# Patient Record
Sex: Female | Born: 1995 | Race: Black or African American | Hispanic: No | Marital: Single | Smoking: Never smoker
Health system: Southern US, Community
[De-identification: ages and names within clinical notes are randomized; demographics above are authoritative.]

## PROBLEM LIST (undated history)

## (undated) DIAGNOSIS — R011 Cardiac murmur, unspecified: Secondary | ICD-10-CM

## (undated) HISTORY — PX: BREAST EXCISIONAL BIOPSY: SUR124

## (undated) HISTORY — PX: UTERINE FIBROID SURGERY: SHX826

---
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
## 2016-08-07 ENCOUNTER — Encounter (HOSPITAL_COMMUNITY): Payer: Self-pay | Admitting: Emergency Medicine

## 2016-08-07 ENCOUNTER — Emergency Department (HOSPITAL_COMMUNITY)
Admission: EM | Admit: 2016-08-07 | Discharge: 2016-08-07 | Disposition: A | Discharge status: Home / Self Care | Payer: 59 | Attending: Emergency Medicine | Admitting: Emergency Medicine

## 2016-08-07 DIAGNOSIS — R1084 Generalized abdominal pain: Secondary | ICD-10-CM

## 2016-08-07 DIAGNOSIS — Z79899 Other long term (current) drug therapy: Secondary | ICD-10-CM | POA: Diagnosis not present

## 2016-08-07 DIAGNOSIS — D649 Anemia, unspecified: Secondary | ICD-10-CM | POA: Diagnosis not present

## 2016-08-07 DIAGNOSIS — R109 Unspecified abdominal pain: Secondary | ICD-10-CM | POA: Diagnosis present

## 2016-08-07 DIAGNOSIS — N920 Excessive and frequent menstruation with regular cycle: Secondary | ICD-10-CM | POA: Insufficient documentation

## 2016-08-07 LAB — COMPREHENSIVE METABOLIC PANEL
ALT: 18 U/L (ref 14–54)
AST: 38 U/L (ref 15–41)
Albumin: 4.3 g/dL (ref 3.5–5.0)
Alkaline Phosphatase: 54 U/L (ref 38–126)
Anion gap: 10 (ref 5–15)
BUN: 9 mg/dL (ref 6–20)
CO2: 19 mmol/L — ABNORMAL LOW (ref 22–32)
Calcium: 9.2 mg/dL (ref 8.9–10.3)
Chloride: 107 mmol/L (ref 101–111)
Creatinine, Ser: 0.69 mg/dL (ref 0.44–1.00)
GFR calc Af Amer: >60 mL/min (ref >60)
GFR calc non Af Amer: >60 mL/min (ref >60)
Glucose, Bld: 161 mg/dL — ABNORMAL HIGH (ref 65–99)
Potassium: 3.4 mmol/L — ABNORMAL LOW (ref 3.5–5.1)
Sodium: 136 mmol/L (ref 135–145)
Total Bilirubin: 0.6 mg/dL (ref 0.3–1.2)
Total Protein: 8.1 g/dL (ref 6.5–8.1)

## 2016-08-07 LAB — CBC
HCT: 30.4 % — ABNORMAL LOW (ref 36.0–46.0)
Hemoglobin: 8.9 g/dL — ABNORMAL LOW (ref 12.0–15.0)
MCH: 19.5 pg — ABNORMAL LOW (ref 26.0–34.0)
MCHC: 29.3 g/dL — ABNORMAL LOW (ref 30.0–36.0)
MCV: 66.7 fL — ABNORMAL LOW (ref 78.0–100.0)
Platelets: 367 10*3/uL (ref 150–400)
RBC: 4.56 MIL/uL (ref 3.87–5.11)
RDW: 18.6 % — ABNORMAL HIGH (ref 11.5–15.5)
WBC: 8.2 10*3/uL (ref 4.0–10.5)

## 2016-08-07 LAB — URINALYSIS, ROUTINE W REFLEX MICROSCOPIC
Bilirubin Urine: NEGATIVE
Glucose, UA: 50 mg/dL — AB
Hgb urine dipstick: NEGATIVE
Ketones, ur: 5 mg/dL — AB
Leukocytes, UA: NEGATIVE
Nitrite: NEGATIVE
Protein, ur: NEGATIVE mg/dL
Specific Gravity, Urine: 1.027 (ref 1.005–1.030)
pH: 5 (ref 5.0–8.0)

## 2016-08-07 LAB — POC URINE PREG, ED: PREG TEST UR: NEGATIVE

## 2016-08-07 LAB — LIPASE, BLOOD: Lipase: 20 U/L (ref 11–51)

## 2016-08-07 MED ORDER — FERROUS SULFATE 325 (65 FE) MG PO TABS: 325.0000 mg | ORAL_TABLET | Freq: Every day | ORAL | 0 refills | Status: DC

## 2016-08-07 MED ORDER — ONDANSETRON HCL 4 MG/2ML IJ SOLN
4.0000 mg | Freq: Once | INTRAMUSCULAR | Status: AC
  Administered 2016-08-07: 4 mg via INTRAVENOUS
  Filled 2016-08-07: qty 2

## 2016-08-07 MED ORDER — MORPHINE SULFATE (PF) 4 MG/ML IV SOLN
4.0000 mg | Freq: Once | INTRAVENOUS | Status: AC
  Administered 2016-08-07: 4 mg via INTRAVENOUS
  Filled 2016-08-07: qty 1

## 2016-08-07 MED ORDER — ONDANSETRON 4 MG PO TBDP
4.0000 mg | ORAL_TABLET | Freq: Three times a day (TID) | ORAL | 0 refills | Status: DC | PRN
Start: 2016-08-07 — End: 2016-10-09

## 2016-08-07 MED ORDER — IBUPROFEN 600 MG PO TABS: 600.0000 mg | ORAL_TABLET | Freq: Four times a day (QID) | ORAL | 0 refills | Status: DC | PRN

## 2016-08-07 MED ORDER — SODIUM CHLORIDE 0.9 % IV BOLUS (SEPSIS)
1000.0000 mL | Freq: Once | INTRAVENOUS | Status: AC
  Administered 2016-08-07: 1000 mL via INTRAVENOUS

## 2016-08-07 NOTE — ED Notes (Signed)
Pt's temp=98.0

## 2016-08-07 NOTE — ED Triage Notes (Signed)
Pt with vomiting since this morning. Pt c/o abdominal pain.

## 2016-08-07 NOTE — ED Provider Notes (Signed)
[facility]-EMERGENCY DEPT Provider Note   CSN: [id number] Arrival date & time: 08/07/16  1243     History   Chief Complaint Chief Complaint  Patient presents with  . Abdominal Pain  . Emesis    HPI Deborah Hawkins is a 21 y.o. female.  Pt presents to the ED today with abd pain, n/v.  The pt said it started this morning around 0530.  Pt has not had any sick contacts.      History reviewed. No pertinent past medical history.  There are no active problems to display for this patient.   History reviewed. No pertinent surgical history.  OB History    No data available       Home Medications    Prior to Admission medications   Medication Sig Start Date End Date Taking? Authorizing Provider  ferrous sulfate 325 (65 FE) MG tablet Take 1 tablet (325 mg total) by mouth daily. 08/07/16   Jacalyn Lefevre, MD  ibuprofen (ADVIL,MOTRIN) 600 MG tablet Take 1 tablet (600 mg total) by mouth every 6 (six) hours as needed. 08/07/16   Jacalyn Lefevre, MD  ondansetron (ZOFRAN ODT) 4 MG disintegrating tablet Take 1 tablet (4 mg total) by mouth every 8 (eight) hours as needed for nausea or vomiting. 08/07/16   Jacalyn Lefevre, MD    Family History History reviewed. No pertinent family history.  Social History Social History  Substance Use Topics  . Smoking status: Never Smoker  . Smokeless tobacco: Never Used  . Alcohol use Yes     Comment: occasional     Allergies   Patient has no known allergies.   Review of Systems Review of Systems  Gastrointestinal: Positive for abdominal pain, nausea and vomiting.  All other systems reviewed and are negative.    Physical Exam Updated Vital Signs BP (!) 95/49 (BP Location: Right Arm)   Pulse 76   Temp 99.3 F (37.4 C) (Oral)   Resp 12   LMP 07/13/2016 (Approximate)   SpO2 99%   Physical Exam  Constitutional: She is oriented to person, place, and time. She appears well-developed and well-nourished.  HENT:  Head: Normocephalic  and atraumatic.  Right Ear: External ear normal.  Left Ear: External ear normal.  Nose: Nose normal.  Mouth/Throat: Mucous membranes are dry.  Eyes: Conjunctivae and EOM are normal. Pupils are equal, round, and reactive to light.  Neck: Normal range of motion. Neck supple.  Cardiovascular: Normal rate, regular rhythm, normal heart sounds and intact distal pulses.   Pulmonary/Chest: Effort normal and breath sounds normal.  Abdominal: Soft. Bowel sounds are normal. There is generalized tenderness.  Musculoskeletal: Normal range of motion.  Neurological: She is alert and oriented to person, place, and time.  Skin: Skin is warm and dry.  Psychiatric: She has a normal mood and affect. Her behavior is normal. Judgment and thought content normal.  Nursing note and vitals reviewed.    ED Treatments / Results  Labs (all labs ordered are listed, but only abnormal results are displayed) Labs Reviewed  COMPREHENSIVE METABOLIC PANEL - Abnormal; Notable for the following:       Result Value   Potassium 3.4 (*)    CO2 19 (*)    Glucose, Bld 161 (*)    All other components within normal limits  CBC - Abnormal; Notable for the following:    Hemoglobin 8.9 (*)    HCT 30.4 (*)    MCV 66.7 (*)    MCH 19.5 (*)  MCHC 29.3 (*)    RDW 18.6 (*)    All other components within normal limits  URINALYSIS, ROUTINE W REFLEX MICROSCOPIC - Abnormal; Notable for the following:    Glucose, UA 50 (*)    Ketones, ur 5 (*)    All other components within normal limits  LIPASE, BLOOD  POC URINE PREG, ED    EKG  EKG Interpretation None       Radiology No results found.  Procedures Procedures (including critical care time)  Medications Ordered in ED Medications  sodium chloride 0.9 % bolus 1,000 mL (1,000 mLs Intravenous New Bag/Given 08/07/16 1446)  morphine 4 MG/ML injection 4 mg (4 mg Intravenous Given 08/07/16 1442)  ondansetron (ZOFRAN) injection 4 mg (4 mg Intravenous Given 08/07/16 1442)      Initial Impression / Assessment and Plan / ED Course  I have reviewed the triage vital signs and the nursing notes.  Pertinent labs & imaging results that were available during my care of the patient were reviewed by me and considered in my medical decision making (see chart for details).    Pt is feeling much better.  She is able to ambulate.  She is able to eat.  Her hemoglobin is low.  She does admit to heavy periods.  She will be started on iron and instructed to f/u with the women's clinic.  She knows to return if worse.  Final Clinical Impressions(s) / ED Diagnoses   Final diagnoses:  Generalized abdominal pain  Anemia, unspecified type  Menorrhagia with regular cycle    New Prescriptions New Prescriptions   FERROUS SULFATE 325 (65 FE) MG TABLET    Take 1 tablet (325 mg total) by mouth daily.   IBUPROFEN (ADVIL,MOTRIN) 600 MG TABLET    Take 1 tablet (600 mg total) by mouth every 6 (six) hours as needed.   ONDANSETRON (ZOFRAN ODT) 4 MG DISINTEGRATING TABLET    Take 1 tablet (4 mg total) by mouth every 8 (eight) hours as needed for nausea or vomiting.     Jacalyn Lefevre, MD 08/07/16 [phone]

## 2016-08-07 NOTE — ED Notes (Signed)
I stuck patint x1, was only about to get 1/2 cc of blood. Patient has been vomiting since 530 am.

## 2016-08-07 NOTE — ED Notes (Signed)
ED Provider at bedside. 

## 2016-08-07 NOTE — ED Notes (Signed)
Pt was asked if could obtain a urine sample, denied at this time.

## 2016-08-21 ENCOUNTER — Ambulatory Visit (INDEPENDENT_AMBULATORY_CARE_PROVIDER_SITE_OTHER): Payer: 59 | Admitting: Obstetrics & Gynecology

## 2016-08-21 ENCOUNTER — Encounter: Payer: Self-pay | Admitting: Obstetrics & Gynecology

## 2016-08-21 VITALS — BP 121/71 | HR 102 | Ht 65.0 in | Wt 138.4 lb

## 2016-08-21 DIAGNOSIS — L739 Follicular disorder, unspecified: Secondary | ICD-10-CM

## 2016-08-21 DIAGNOSIS — Z113 Encounter for screening for infections with a predominantly sexual mode of transmission: Secondary | ICD-10-CM | POA: Diagnosis not present

## 2016-08-21 DIAGNOSIS — N898 Other specified noninflammatory disorders of vagina: Secondary | ICD-10-CM

## 2016-08-21 DIAGNOSIS — D500 Iron deficiency anemia secondary to blood loss (chronic): Secondary | ICD-10-CM

## 2016-08-21 MED ORDER — NORETHIN ACE-ETH ESTRAD-FE 1-20 MG-MCG(24) PO TABS: 1.0000 | ORAL_TABLET | Freq: Every day | ORAL | 4 refills | Status: DC

## 2016-08-21 MED ORDER — FERROUS SULFATE 325 (65 FE) MG PO TABS: 325.0000 mg | ORAL_TABLET | Freq: Two times a day (BID) | ORAL | 1 refills | Status: DC

## 2016-08-21 MED ORDER — SULFAMETHOXAZOLE-TRIMETHOPRIM 800-160 MG PO TABS: 1.0000 | ORAL_TABLET | Freq: Two times a day (BID) | ORAL | 1 refills | Status: DC

## 2016-08-21 NOTE — Progress Notes (Signed)
History:  21 y.o. No obstetric history on file.G0 here today for viral infection and viral gastroenteritis.  So all of those sx are resolved. But, pt was noted to have anemia. Pt reports monthly cycles. 28-29 days. They last 3-4 day but, are heavy with clots.  The clots can be larger than a sliver dollar. This occurs with each cycle. Pt had a PAP in Dec 2017 at [facility].  Depo Provera last 2 years prev.   Pt prev sexually active. With males last 4 weeks.   Pt c/o white discharge that she has had for 6 months.      The following portions of the patient's history were reviewed and updated as appropriate: allergies, current medications, past family history, past medical history, past social history, past surgical history and problem list.  Review of Systems:  Pertinent items are noted in HPI.   Objective:  Physical Exam Blood pressure 121/71, pulse (!) 102, height  (1.651 m), weight 138 lb 6.4 oz (62.8 kg), last menstrual period 08/12/2016. BP 121/71   Pulse (!) 102   Ht  (1.651 m)   Wt 138 lb 6.4 oz (62.8 kg)   LMP 08/12/2016 (Exact Date)   BMI 23.03 kg/m  CONSTITUTIONAL: Well-developed, well-nourished female in no acute distress.  HENT:  Normocephalic, atraumatic EYES: Conjunctivae and EOM are normal. No scleral icterus.  NECK: Normal range of motion SKIN: Skin is warm and dry. No rash noted. Not diaphoretic.No pallor. NEUROLGIC: Alert and oriented to person, place, and time. Normal coordination.  Abd: Soft, nontender and nondistended Pelvic: pt has evidence of follicles of various ages around perineum and vulva. In the lower abd these are some that are new and pustular in nature.  No warmth or erythema/ Normal appearing vaginal mucosa and cervix.  Normal discharge.    Labs and Imaging CBC    Component Value Date/Time   WBC 8.2 08/07/2016 1435   RBC 4.56 08/07/2016 1435   HGB 8.9 (L) 08/07/2016 1435   HCT 30.4 (L) 08/07/2016 1435   PLT 367 08/07/2016 1435    MCV 66.7 (L) 08/07/2016 1435   MCH 19.5 (L) 08/07/2016 1435   MCHC 29.3 (L) 08/07/2016 1435   RDW 18.6 (H) 08/07/2016 1435      Assessment & Plan:  Vulvar folliculitis Iron def anemia thought due to Menorrhegia  Vaginal discharge- suspect physiologic  Bactrim DS 1 po bid x 7 days Begin OCPs LoEtrin continuous  f/u in 3 months or sooner prn GC/chlmydia screen Wet mount and KOH Keep Ferrous sulfate daily on an empty stomach  Total face-to-face time with patient was 20 min.  Greater than 50% was spent in counseling and coordination of care with the patient.    [name] L. Harraway-Smith, M.D., Evern Core

## 2016-08-22 ENCOUNTER — Other Ambulatory Visit: Payer: Self-pay

## 2016-08-23 LAB — CERVICOVAGINAL ANCILLARY ONLY
BACTERIAL VAGINITIS: POSITIVE — AB
CANDIDA VAGINITIS: POSITIVE — AB
CHLAMYDIA, DNA PROBE: NEGATIVE
Neisseria Gonorrhea: NEGATIVE
Trichomonas: NEGATIVE

## 2016-08-27 ENCOUNTER — Other Ambulatory Visit: Payer: Self-pay | Admitting: Obstetrics & Gynecology

## 2016-08-27 DIAGNOSIS — N760 Acute vaginitis: Principal | ICD-10-CM

## 2016-08-27 DIAGNOSIS — B9689 Other specified bacterial agents as the cause of diseases classified elsewhere: Secondary | ICD-10-CM

## 2016-08-27 DIAGNOSIS — B3731 Acute candidiasis of vulva and vagina: Secondary | ICD-10-CM

## 2016-08-27 DIAGNOSIS — B373 Candidiasis of vulva and vagina: Secondary | ICD-10-CM

## 2016-08-27 MED ORDER — METRONIDAZOLE 500 MG PO TABS: 500.0000 mg | ORAL_TABLET | Freq: Two times a day (BID) | ORAL | 0 refills | Status: DC

## 2016-08-27 MED ORDER — FLUCONAZOLE 150 MG PO TABS: 150.0000 mg | ORAL_TABLET | Freq: Once | ORAL | 3 refills | Status: AC

## 2016-08-30 ENCOUNTER — Emergency Department (HOSPITAL_COMMUNITY)
Admission: EM | Admit: 2016-08-30 | Discharge: 2016-08-31 | Disposition: A | Discharge status: Home / Self Care | Payer: 59 | Attending: Emergency Medicine | Admitting: Emergency Medicine

## 2016-08-30 ENCOUNTER — Encounter (HOSPITAL_COMMUNITY): Payer: Self-pay | Admitting: Emergency Medicine

## 2016-08-30 ENCOUNTER — Emergency Department (HOSPITAL_COMMUNITY): Payer: 59

## 2016-08-30 DIAGNOSIS — Z79899 Other long term (current) drug therapy: Secondary | ICD-10-CM | POA: Insufficient documentation

## 2016-08-30 DIAGNOSIS — S199XXA Unspecified injury of neck, initial encounter: Secondary | ICD-10-CM | POA: Diagnosis present

## 2016-08-30 DIAGNOSIS — Y92410 Unspecified street and highway as the place of occurrence of the external cause: Secondary | ICD-10-CM | POA: Insufficient documentation

## 2016-08-30 DIAGNOSIS — Y999 Unspecified external cause status: Secondary | ICD-10-CM | POA: Diagnosis not present

## 2016-08-30 DIAGNOSIS — Y939 Activity, unspecified: Secondary | ICD-10-CM | POA: Diagnosis not present

## 2016-08-30 DIAGNOSIS — S39012A Strain of muscle, fascia and tendon of lower back, initial encounter: Secondary | ICD-10-CM

## 2016-08-30 DIAGNOSIS — S161XXA Strain of muscle, fascia and tendon at neck level, initial encounter: Secondary | ICD-10-CM

## 2016-08-30 HISTORY — DX: Cardiac murmur, unspecified: R01.1

## 2016-08-30 MED ORDER — TRAMADOL HCL 50 MG PO TABS: 50.0000 mg | ORAL_TABLET | Freq: Four times a day (QID) | ORAL | 0 refills | Status: DC | PRN

## 2016-08-30 MED ORDER — IBUPROFEN 800 MG PO TABS: 800.0000 mg | ORAL_TABLET | Freq: Three times a day (TID) | ORAL | 0 refills | Status: DC | PRN
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
## 2016-08-30 NOTE — Discharge Instructions (Signed)
Return here as needed.  Follow-up with your primary care doctor.  Your x-rays did not show any abnormality.  Use ice and heat on the areas that are sore

## 2016-08-31 NOTE — ED Notes (Signed)
Pt ambulatory and independent at discharge.  Verbalized understanding of discharge instructions 

## 2016-09-12 ENCOUNTER — Encounter (HOSPITAL_COMMUNITY): Payer: Self-pay

## 2016-09-12 ENCOUNTER — Emergency Department (HOSPITAL_COMMUNITY)
Admission: EM | Admit: 2016-09-12 | Discharge: 2016-09-13 | Disposition: A | Discharge status: Home / Self Care | Payer: 59 | Attending: Emergency Medicine | Admitting: Emergency Medicine

## 2016-09-12 DIAGNOSIS — Y92410 Unspecified street and highway as the place of occurrence of the external cause: Secondary | ICD-10-CM | POA: Insufficient documentation

## 2016-09-12 DIAGNOSIS — S39012A Strain of muscle, fascia and tendon of lower back, initial encounter: Secondary | ICD-10-CM

## 2016-09-12 DIAGNOSIS — Z79899 Other long term (current) drug therapy: Secondary | ICD-10-CM | POA: Insufficient documentation

## 2016-09-12 DIAGNOSIS — Y999 Unspecified external cause status: Secondary | ICD-10-CM | POA: Diagnosis not present

## 2016-09-12 DIAGNOSIS — S161XXA Strain of muscle, fascia and tendon at neck level, initial encounter: Secondary | ICD-10-CM | POA: Insufficient documentation

## 2016-09-12 DIAGNOSIS — Y939 Activity, unspecified: Secondary | ICD-10-CM | POA: Diagnosis not present

## 2016-09-12 DIAGNOSIS — S199XXA Unspecified injury of neck, initial encounter: Secondary | ICD-10-CM | POA: Diagnosis present
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
## 2016-09-13 MED ORDER — MELOXICAM 15 MG PO TABS: 15.0000 mg | ORAL_TABLET | Freq: Every day | ORAL | 0 refills | Status: DC

## 2016-09-13 MED ORDER — BACLOFEN 10 MG PO TABS: 10.0000 mg | ORAL_TABLET | Freq: Three times a day (TID) | ORAL | 0 refills | Status: DC
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
## 2016-09-13 NOTE — Discharge Instructions (Signed)
[facility] [address], [place] [id number] [phone]
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
## 2016-10-09 ENCOUNTER — Encounter: Payer: Self-pay | Admitting: Family Medicine

## 2016-10-09 ENCOUNTER — Ambulatory Visit (INDEPENDENT_AMBULATORY_CARE_PROVIDER_SITE_OTHER): Payer: 59 | Admitting: Family Medicine

## 2016-10-09 ENCOUNTER — Telehealth: Payer: Self-pay | Admitting: Family Medicine

## 2016-10-09 DIAGNOSIS — S338XXA Sprain of other parts of lumbar spine and pelvis, initial encounter: Secondary | ICD-10-CM | POA: Diagnosis not present

## 2016-10-09 MED ORDER — PREDNISONE 50 MG PO TABS: 50.0000 mg | ORAL_TABLET | Freq: Every day | ORAL | 0 refills | Status: DC

## 2016-10-09 MED ORDER — GABAPENTIN 100 MG PO CAPS: 200.0000 mg | ORAL_CAPSULE | Freq: Every day | ORAL | 3 refills | Status: DC

## 2016-10-09 NOTE — Telephone Encounter (Signed)
Pt needs her gabapentin and prednisone resent [facility] [address] [place]

## 2016-10-09 NOTE — Patient Instructions (Addendum)
Good to see you.   Ice 20 minutes 2 times daily. Usually after activity and before bed. Exercises 3 times a week.  pennsaid pinkie amount topically 2 times daily as needed.  Prednisone daily for 5 days Gabapentin 200mg  at night See me again in 2 weeks (OK to double book)

## 2016-10-09 NOTE — Assessment & Plan Note (Signed)
I believe the patient does have a sprain of the iliolumbar ligament. Discussed with patient at great length. I do not feel that advance imaging is warranted. I do believe the patient would be able to do seated work as well as change positions regularly. We discussed icing regimen, given prednisone and gabapentin secondary to the nighttime pain. Patient given home exercises and work with Event organiserathletic trainer. Follow-up again in 2-3 weeks.

## 2016-10-09 NOTE — Telephone Encounter (Signed)
Resent rx to pharmacy.

## 2016-10-18 ENCOUNTER — Other Ambulatory Visit: Payer: Self-pay | Admitting: General Practice

## 2016-10-18 DIAGNOSIS — N938 Other specified abnormal uterine and vaginal bleeding: Secondary | ICD-10-CM

## 2016-10-18 MED ORDER — FERROUS SULFATE 325 (65 FE) MG PO TABS: 325.0000 mg | ORAL_TABLET | Freq: Two times a day (BID) | ORAL | 2 refills | Status: DC

## 2016-10-25 ENCOUNTER — Other Ambulatory Visit: Payer: Self-pay

## 2016-10-25 ENCOUNTER — Encounter: Payer: Self-pay | Admitting: Family Medicine

## 2016-10-25 ENCOUNTER — Ambulatory Visit (INDEPENDENT_AMBULATORY_CARE_PROVIDER_SITE_OTHER): Payer: 59 | Admitting: Family Medicine

## 2016-10-25 DIAGNOSIS — S338XXD Sprain of other parts of lumbar spine and pelvis, subsequent encounter: Secondary | ICD-10-CM | POA: Diagnosis not present

## 2016-10-25 MED ORDER — VITAMIN D (ERGOCALCIFEROL) 1.25 MG (50000 UNIT) PO CAPS: 50000.0000 [IU] | ORAL_CAPSULE | ORAL | 0 refills | Status: DC
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
## 2016-10-25 NOTE — Patient Instructions (Signed)
Good to see you  Deborah Hawkins is your friend.  We will send in the paperwork.  pennsaid pinkie amount topically 2 times daily as needed.  Stay active but biking, elliptical  Once weekly vitamin D for 12 weeks.  See me again one more time in 4 weeks.

## 2016-10-25 NOTE — Assessment & Plan Note (Signed)
Still having significant tightness. I believe the patient's sedentary works has not contributing. Patient will have ergonomic changes and we'll have forms filled out in the entirety. We discussed continuing the gabapentin help at night, meloxicam during the day, once weekly vitamin D to help with muscle strength. Decline formal physical therapy but we'll continue with the home exercises. Will follow-up with me again in 4 weeks.

## 2016-10-30 DIAGNOSIS — Z0279 Encounter for issue of other medical certificate: Secondary | ICD-10-CM

## 2016-10-31 ENCOUNTER — Telehealth: Payer: Self-pay | Admitting: Family Medicine

## 2016-10-31 NOTE — Telephone Encounter (Signed)
Pt called stating that she is having increased back pain. The medication that was prescribed is not helping. We moved her appointment up to the first available (11/19/16).

## 2016-11-12 ENCOUNTER — Telehealth: Payer: Self-pay | Admitting: *Deleted

## 2016-11-12 DIAGNOSIS — N760 Acute vaginitis: Principal | ICD-10-CM

## 2016-11-12 DIAGNOSIS — B9689 Other specified bacterial agents as the cause of diseases classified elsewhere: Secondary | ICD-10-CM

## 2016-11-12 MED ORDER — METRONIDAZOLE 500 MG PO TABS: 500.0000 mg | ORAL_TABLET | Freq: Two times a day (BID) | ORAL | 0 refills | Status: DC

## 2016-11-12 NOTE — Telephone Encounter (Signed)
Called pt and discussed refill request for Flagyl which was received from her pharmacy.  She stated that she is having the same sx (gray d/c and odor) that she had in May when she was treated for BV. I advised pt that I will send Rx today. She has office appt on 8/9 @ 2pm and should discuss this reoccurrence with the doctor at that time in case any different plan of care is indicated.  Pt agreed and voiced understanding.

## 2016-11-14 ENCOUNTER — Encounter: Payer: Self-pay | Admitting: Family Medicine

## 2016-11-14 ENCOUNTER — Ambulatory Visit (INDEPENDENT_AMBULATORY_CARE_PROVIDER_SITE_OTHER): Payer: 59 | Admitting: Family Medicine

## 2016-11-14 VITALS — BP 100/70 | HR 72 | Ht 65.5 in | Wt 138.0 lb

## 2016-11-14 DIAGNOSIS — M5416 Radiculopathy, lumbar region: Secondary | ICD-10-CM

## 2016-11-14 DIAGNOSIS — S338XXA Sprain of other parts of lumbar spine and pelvis, initial encounter: Secondary | ICD-10-CM | POA: Diagnosis not present
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
## 2016-11-14 NOTE — Assessment & Plan Note (Signed)
Patient is now having more of a lumbar radiculopathy.

## 2016-11-14 NOTE — Patient Instructions (Addendum)
Good to see you  I am sorry you are not better Continue the medicines.  We will get MRI and see what is going on .  I will release it in my chart and then we will discuss.

## 2016-11-14 NOTE — Assessment & Plan Note (Signed)
Worsening symptoms overall with now more of a lumbar radiculopathy. Patient has had this pain for several months that has not responded to conservative therapy including medications as well as formal physical therapy. Patient is now having some mild weakness. Does have an audible crepitus on range of motion that was calcific changes secondary to a sprain of the iliolumbar ligament but now could be something more of a occult fracture. Patient will have an MRI for further evaluation. Depending on findings this could change medical management. Patient could be a candidate for epidural injections. This pain is dull unrelenting and is affecting patient's daily activities as well as her job performance.

## 2016-11-19 ENCOUNTER — Ambulatory Visit: Payer: 59 | Admitting: Family Medicine

## 2016-11-22 ENCOUNTER — Ambulatory Visit: Payer: 59 | Admitting: Family Medicine

## 2016-11-28 ENCOUNTER — Ambulatory Visit
Admission: RE | Admit: 2016-11-28 | Discharge: 2016-11-28 | Disposition: A | Discharge status: Home / Self Care | Payer: Self-pay | Source: Ambulatory Visit | Attending: Family Medicine | Admitting: Family Medicine

## 2016-11-28 DIAGNOSIS — S338XXA Sprain of other parts of lumbar spine and pelvis, initial encounter: Secondary | ICD-10-CM

## 2016-11-29 ENCOUNTER — Encounter: Payer: Self-pay | Admitting: Obstetrics & Gynecology

## 2016-11-29 ENCOUNTER — Ambulatory Visit (INDEPENDENT_AMBULATORY_CARE_PROVIDER_SITE_OTHER): Payer: 59 | Admitting: Obstetrics & Gynecology

## 2016-11-29 VITALS — BP 110/67 | HR 63 | Ht 65.0 in | Wt 137.7 lb

## 2016-11-29 DIAGNOSIS — N939 Abnormal uterine and vaginal bleeding, unspecified: Secondary | ICD-10-CM

## 2016-11-29 DIAGNOSIS — N946 Dysmenorrhea, unspecified: Secondary | ICD-10-CM

## 2016-11-29 MED ORDER — NORETHIN ACE-ETH ESTRAD-FE 1.5-30 MG-MCG PO TABS: 1.0000 | ORAL_TABLET | Freq: Every day | ORAL | 11 refills | Status: DC

## 2016-11-29 NOTE — Progress Notes (Signed)
History:  21 y.o. G1P1000 here today for on OCPs. Pt still reports a white discharge. She reports that her menses are still heavy and seem to be coming earlier.  She reports that she is still having pain with her cycles.  The following portions of the patient's history were reviewed and updated as appropriate: allergies, current medications, past family history, past medical history, past social history, past surgical history and problem list.  Review of Systems:  Pertinent items are noted in HPI.   Objective:  Physical Exam Blood pressure 110/67, pulse 63, height 5\' 5"  (1.651 m), weight 137 lb 11.2 oz (62.5 kg), last menstrual period 11/26/2016.  CONSTITUTIONAL: Well-developed, well-nourished female in no acute distress.  HENT:  Normocephalic, atraumatic EYES: Conjunctivae and EOM are normal. No scleral icterus.  NECK: Normal range of motion SKIN: Skin is warm and dry. No rash noted. Not diaphoretic.No pallor. NEUROLGIC: Alert and oriented to person, place, and time. Normal coordination.  Pelvic: deferred   Assessment & Plan:  AUB - not improved on low dose OCP's. Will change ot a 30mcg OCP and reassess  Stop LoEstrin 1/20  Begin LoEstrin 1.5/30  F/u in 3 months or sooner prn  Reviewed physiologic discharge and the changes of discharge on OCPs. Pt expressed understanding and relief.  Total face-to-face time with patient was 15 min.  Greater than 50% was spent in counseling and coordination of care with the patient.   Dezerae Freiberger L. Harraway-Smith, M.D., [facility] CoreFACOG

## 2016-11-29 NOTE — Patient Instructions (Signed)

## 2016-11-29 NOTE — Progress Notes (Signed)
States never got flagyl the last time it was sent in because sent to wrong pharmacy.

## 2016-11-30 ENCOUNTER — Encounter: Payer: Self-pay | Admitting: Obstetrics & Gynecology

## 2016-12-03 ENCOUNTER — Telehealth: Payer: Self-pay | Admitting: Family Medicine

## 2016-12-03 NOTE — Telephone Encounter (Signed)
Patient called in requesting results of MRI.  Gave patient MD response.  Patient is still having pain.  Please follow up with patient in regard.  She can be reached between 2:45pm and 3:45pm or before 10am.

## 2016-12-04 NOTE — Telephone Encounter (Signed)
Spoke to pt, scheduled her for 8.16.18

## 2016-12-04 NOTE — Telephone Encounter (Signed)
Means it is all musculature.  Good news overall.  We can have her come in and we can start some manipulation, possible medicines and consider physical therapy.  No reason she should not get 100% better

## 2016-12-04 NOTE — Telephone Encounter (Signed)
Called pt, unable to leave a message
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
## 2016-12-06 ENCOUNTER — Encounter: Payer: Self-pay | Admitting: Family Medicine

## 2016-12-06 ENCOUNTER — Ambulatory Visit (INDEPENDENT_AMBULATORY_CARE_PROVIDER_SITE_OTHER): Payer: 59 | Admitting: Family Medicine

## 2016-12-06 VITALS — BP 104/78 | Ht 65.0 in | Wt 135.0 lb

## 2016-12-06 DIAGNOSIS — S338XXD Sprain of other parts of lumbar spine and pelvis, subsequent encounter: Secondary | ICD-10-CM

## 2016-12-06 DIAGNOSIS — G8929 Other chronic pain: Secondary | ICD-10-CM

## 2016-12-06 DIAGNOSIS — M545 Low back pain: Secondary | ICD-10-CM

## 2016-12-06 NOTE — Patient Instructions (Signed)
Good to see you  Ok to take baclofen and the gabapentin at night together if needed.  Ice still is your friend PT will be calling you  Get back in the gym. Start with low impact exercises Read about effexor and see what you think See me again in 6 weeks

## 2016-12-06 NOTE — Assessment & Plan Note (Signed)
Patient has made some improvement. MRI did not show any type of nerve impingement. Patient will be sent to formal physical therapy. Continue the gabapentin more at night on a regular basis. Patient has not been taking them regularly. Patient has been fairly noncompliant even with the home exercises. Hopefully patient will make improvement. Continues to work. No significant improvement again possibly medications such as Effexor for reflexive that the dystrophy could be possible.

## 2016-12-20 ENCOUNTER — Encounter: Payer: Self-pay | Admitting: Physical Therapy

## 2016-12-20 ENCOUNTER — Ambulatory Visit: Payer: 59 | Attending: Family Medicine | Admitting: Physical Therapy

## 2016-12-20 DIAGNOSIS — M62830 Muscle spasm of back: Secondary | ICD-10-CM

## 2016-12-20 DIAGNOSIS — M542 Cervicalgia: Secondary | ICD-10-CM

## 2016-12-20 DIAGNOSIS — M545 Low back pain, unspecified: Secondary | ICD-10-CM
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
## 2016-12-26 ENCOUNTER — Encounter: Payer: Self-pay | Admitting: Physical Therapy

## 2016-12-26 ENCOUNTER — Ambulatory Visit: Payer: 59 | Attending: Family Medicine | Admitting: Physical Therapy

## 2016-12-26 DIAGNOSIS — M545 Low back pain, unspecified: Secondary | ICD-10-CM

## 2016-12-26 DIAGNOSIS — M542 Cervicalgia: Secondary | ICD-10-CM | POA: Diagnosis present

## 2016-12-26 DIAGNOSIS — M62830 Muscle spasm of back: Secondary | ICD-10-CM | POA: Insufficient documentation

## 2016-12-26 NOTE — Therapy (Signed)
[facility] [address] [place], [place], [address] Phone: [phone]   Fax:  [phone]  Physical Therapy Treatment  Patient Details  Name: Deborah Hawkins MRN: [id number] Date of Birth: [birth year] Referring Provider: Terrilee Files  Encounter Date: 12/26/2016      PT End of Session - 12/26/16 1435    Visit Number 2   Date for PT Re-Evaluation 02/19/17   PT Start Time 1345   PT Stop Time 1446   PT Time Calculation (min) 61 min   Activity Tolerance Patient tolerated treatment well   Behavior During Therapy [facility] for tasks assessed/performed      Past Medical History:  Diagnosis Date  . Heart murmur     Past Surgical History:  Procedure Laterality Date  . UTERINE FIBROID SURGERY      There were no vitals filed for this visit.      Subjective Assessment - 12/26/16 1347    Subjective Pt reports no change since evaluation   Currently in Pain? No/denies   Pain Score 0-No pain                         [facility] Adult PT Treatment/Exercise - 12/26/16 0001      Exercises   Exercises Lumbar     Lumbar Exercises: Stretches   Passive Hamstring Stretch 4 reps;10 seconds   Single Knee to Chest Stretch 3 reps;10 seconds   Lower Trunk Rotation 3 reps;10 seconds   Piriformis Stretch 3 reps;10 seconds     Lumbar Exercises: Aerobic   Stationary Bike NuStep L1 x6 min     Lumbar Exercises: Machines for Strengthening   Cybex Knee Flexion 20lb 2x10      Lumbar Exercises: Standing   Row 20 reps;Theraband;Both   Shoulder Extension Theraband;20 reps;Both     Lumbar Exercises: Supine   Bridge 20 reps;2 seconds     Modalities   Modalities Moist Heat;Electrical Stimulation     Moist Heat Therapy   Number Minutes Moist Heat 15 Minutes   Moist Heat Location Lumbar Spine;Cervical     Electrical Stimulation   Electrical Stimulation Location lumbar area   Electrical Stimulation Action IFC    Electrical Stimulation Parameters supine   Electrical Stimulation Goals Pain                  PT Short Term Goals - 12/20/16 1344      PT SHORT TERM GOAL #1   Title independent with initial HEP   Time 1   Period Weeks   Status New           PT Long Term Goals - 12/20/16 1427      PT LONG TERM GOAL #1   Title understand proper posture and body mechanics   Time 8   Period Weeks   Status New     PT LONG TERM GOAL #2   Title decrease pain 50%   Time 8   Period Weeks   Status New     PT LONG TERM GOAL #3   Title increase lumbar ROM to WNL's   Time 8   Period Weeks   Status New     PT LONG TERM GOAL #4   Title sit without difficulty   Time 8   Period Weeks   Status New               Plan - 12/26/16 1436  Clinical Impression Statement Pt tolerated an initial progression to exercises well. Does reports a pulling in the back of her lt with passive HS stretch. Pt reports the supine bridges were tiring.    Rehab Potential Good   PT Frequency 2x / week   PT Treatment/Interventions ADLs/Self Care Home Management;Electrical Stimulation;Moist Heat;Therapeutic activities;Therapeutic exercise;Neuromuscular re-education;Patient/family education;Manual techniques   PT Next Visit Plan Slowly add gym exercises and stretches      Patient will benefit from skilled therapeutic intervention in order to improve the following deficits and impairments:  Decreased activity tolerance, Decreased mobility, Decreased strength, Postural dysfunction, Improper body mechanics, Impaired flexibility, Pain, Increased muscle spasms, Decreased range of motion  Visit Diagnosis: Muscle spasm of back  Acute bilateral low back pain without sciatica  Cervicalgia     Problem List Patient Active Problem List   Diagnosis Date Noted  . Lumbar radiculopathy 11/14/2016  . Sprain of iliolumbar ligament 10/09/2016    Grayce Sessionsonald G Elvie Palomo, PTA 12/26/2016, 2:39 PM  [facility] [address] [place], [place], [address] Phone: [phone]   Fax:  [phone]  Name: Payton MccallumJakinula Mochizuki MRN: [id number] Date of Birth: 20-Mar-1996

## 2016-12-28 ENCOUNTER — Ambulatory Visit: Payer: 59 | Admitting: Physical Therapy

## 2016-12-28 ENCOUNTER — Encounter: Payer: Self-pay | Admitting: Physical Therapy

## 2016-12-28 DIAGNOSIS — M62830 Muscle spasm of back: Secondary | ICD-10-CM | POA: Diagnosis not present

## 2016-12-28 DIAGNOSIS — M545 Low back pain, unspecified: Secondary | ICD-10-CM

## 2016-12-28 DIAGNOSIS — M542 Cervicalgia: Secondary | ICD-10-CM

## 2016-12-28 NOTE — Therapy (Signed)
[facility] [address] [place], [place], [address] Phone: [phone]   Fax:  [phone]  Physical Therapy Treatment  Patient Details  Name: Deborah Hawkins MRN: [id number] Date of Birth: 1995/07/28 Referring Provider: Terrilee Files  Encounter Date: 12/28/2016      PT End of Session - 12/28/16 1102    Visit Number 3   Date for PT Re-Evaluation 02/19/17   PT Start Time 1015   PT Stop Time 1114   PT Time Calculation (min) 59 min   Activity Tolerance Patient tolerated treatment well   Behavior During Therapy [facility] for tasks assessed/performed      Past Medical History:  Diagnosis Date  . Heart murmur     Past Surgical History:  Procedure Laterality Date  . UTERINE FIBROID SURGERY      There were no vitals filed for this visit.      Subjective Assessment - 12/28/16 1021    Subjective Pt reports that she was fine after last treatment. Pt reports that she is just tired today   Currently in Pain? No/denies   Pain Score 0-No pain            [facility] PT Assessment - 12/28/16 0001      AROM   Overall AROM Comments Lumbar ROM decreased 50% with c/o stiffness, pain in the low back, cervical ROM decreased 50% with Ext                      [facility] Adult PT Treatment/Exercise - 12/28/16 0001      Lumbar Exercises: Stretches   Passive Hamstring Stretch 4 reps;10 seconds   Single Knee to Chest Stretch 2 reps;10 seconds   Lower Trunk Rotation 4 reps;10 seconds     Lumbar Exercises: Machines for Strengthening   Cybex Knee Extension 5lb 2x10    Cybex Knee Flexion 20lb 2x10    Other Lumbar Machine Exercise rows      Lumbar Exercises: Standing   Row 20 reps;Theraband;Both   Theraband Level (Row) Level 2 (Red)     Lumbar Exercises: Supine   Bridge 20 reps;2 seconds     Modalities   Modalities Moist Heat;Electrical Stimulation     Moist Heat Therapy   Number Minutes Moist Heat 15 Minutes    Moist Heat Location Lumbar Spine;Cervical     Electrical Stimulation   Electrical Stimulation Location lumbar area   Electrical Stimulation Action IFC   Electrical Stimulation Parameters supine   Electrical Stimulation Goals Pain                  PT Short Term Goals - 12/20/16 1344      PT SHORT TERM GOAL #1   Title independent with initial HEP   Time 1   Period Weeks   Status New           PT Long Term Goals - 12/20/16 1427      PT LONG TERM GOAL #1   Title understand proper posture and body mechanics   Time 8   Period Weeks   Status New     PT LONG TERM GOAL #2   Title decrease pain 50%   Time 8   Period Weeks   Status New     PT LONG TERM GOAL #3   Title increase lumbar ROM to WNL's   Time 8   Period Weeks   Status New     PT LONG  TERM GOAL #4   Title sit without difficulty   Time 8   Period Weeks   Status New               Plan - 12/28/16 1103    Clinical Impression Statement Pt able to perform all of today's interventions, Pt appears to have a struggle with seated rows even with a low weight. Pt with difficulty relaxing during passives stretching, but repots little pain during   Rehab Potential Good   PT Frequency 2x / week   PT Treatment/Interventions ADLs/Self Care Home Management;Electrical Stimulation;Moist Heat;Therapeutic activities;Therapeutic exercise;Neuromuscular re-education;Patient/family education;Manual techniques   PT Next Visit Plan Slowly add gym exercises and stretches      Patient will benefit from skilled therapeutic intervention in order to improve the following deficits and impairments:  Decreased activity tolerance, Decreased mobility, Decreased strength, Postural dysfunction, Improper body mechanics, Impaired flexibility, Pain, Increased muscle spasms, Decreased range of motion  Visit Diagnosis: Muscle spasm of back  Acute bilateral low back pain without sciatica  Cervicalgia     Problem  List Patient Active Problem List   Diagnosis Date Noted  . Lumbar radiculopathy 11/14/2016  . Sprain of iliolumbar ligament 10/09/2016    Grayce Sessionsonald G Shatara Stanek, PTA 12/28/2016, 11:07 AM  [facility] [address] [place], [place], [address] Phone: [phone]   Fax:  [phone]  Name: Deborah Hawkins MRN: [id number] Date of Birth: 10-Dec-1995

## 2016-12-31 ENCOUNTER — Telehealth: Payer: Self-pay | Admitting: Family Medicine

## 2016-12-31 NOTE — Telephone Encounter (Signed)
Patient states she can be reached at 2:45 to 3:00pm.  Patient is going to drop off FMLA forms.  She also states that she is suppose to come back for a follow up with Dr. Katrinka BlazingSmith but she has not been discharged yet from PT.  Would like a call back in regard.

## 2017-01-01 ENCOUNTER — Ambulatory Visit: Payer: 59 | Admitting: Physical Therapy

## 2017-01-01 NOTE — Telephone Encounter (Signed)
Called pt, she has a vmail box that has not been setup yet. Unable to leave a msg.

## 2017-01-02 ENCOUNTER — Ambulatory Visit: Payer: 59 | Admitting: Physical Therapy

## 2017-01-02 ENCOUNTER — Encounter: Payer: Self-pay | Admitting: Physical Therapy

## 2017-01-02 DIAGNOSIS — M62830 Muscle spasm of back: Secondary | ICD-10-CM

## 2017-01-02 DIAGNOSIS — M545 Low back pain, unspecified: Secondary | ICD-10-CM

## 2017-01-02 DIAGNOSIS — M542 Cervicalgia: Secondary | ICD-10-CM
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
## 2017-01-04 ENCOUNTER — Ambulatory Visit: Payer: 59 | Admitting: Physical Therapy

## 2017-01-07 ENCOUNTER — Ambulatory Visit (INDEPENDENT_AMBULATORY_CARE_PROVIDER_SITE_OTHER): Payer: 59 | Admitting: Family Medicine

## 2017-01-07 ENCOUNTER — Encounter: Payer: Self-pay | Admitting: Family Medicine

## 2017-01-07 DIAGNOSIS — S338XXD Sprain of other parts of lumbar spine and pelvis, subsequent encounter: Secondary | ICD-10-CM

## 2017-01-07 MED ORDER — HYDROXYZINE HCL 10 MG PO TABS: 10.0000 mg | ORAL_TABLET | Freq: Three times a day (TID) | ORAL | 0 refills | Status: DC | PRN

## 2017-01-07 NOTE — Assessment & Plan Note (Signed)
Patient is improving with conservative therapy now. Has increased range of motion as well as having decreasing strength. Patient is only taking the gabapentin as needed. Once weekly vitamin D encourage still. Has meloxicam for breakthrough pain. Patient will start increasing activity as tolerated. Follow-up with me again weeks patient was given an FMLA paperwork to finish patient's physical therapy.

## 2017-01-07 NOTE — Telephone Encounter (Signed)
Spoke to pt, scheduled her today @ 1:30pm.
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
## 2017-01-08 ENCOUNTER — Encounter: Payer: Self-pay | Admitting: Physical Therapy

## 2017-01-08 ENCOUNTER — Ambulatory Visit: Payer: 59 | Admitting: Physical Therapy

## 2017-01-08 DIAGNOSIS — M545 Low back pain, unspecified: Secondary | ICD-10-CM

## 2017-01-08 DIAGNOSIS — M62830 Muscle spasm of back: Secondary | ICD-10-CM

## 2017-01-08 DIAGNOSIS — M542 Cervicalgia: Secondary | ICD-10-CM

## 2017-01-08 NOTE — Therapy (Signed)
[place], [place], [address] Phone: [phone]   Fax:  [phone]  Physical Therapy Treatment  Patient Details  Name: Deborah Hawkins MRN: [id number] Date of Birth: [birth year] Referring Provider: Charlann Boxer  Encounter Date: 01/08/2017      PT End of Session - 01/08/17 1439    Visit Number 5   Date for PT Re-Evaluation 02/19/17   PT Start Time 1410   PT Stop Time 1505   PT Time Calculation (min) 55 min      Past Medical History:  Diagnosis Date  . Heart murmur     Past Surgical History:  Procedure Laterality Date  . UTERINE FIBROID SURGERY      There were no vitals filed for this visit.      Subjective Assessment - 01/08/17 1409    Subjective moved alot of stuff yesterday so hurts more.   Currently in Pain? Yes   Pain Score 6    Pain Location Back            [facility] PT Assessment - 01/08/17 0001      AROM   Overall AROM Comments Cerv WFLs. Lumbar WFLs c/o pain with flex                     [facility] Adult PT Treatment/Exercise - 01/08/17 0001      Lumbar Exercises: Aerobic   Stationary Bike NuStep L5 x6 min     Lumbar Exercises: Machines for Strengthening   Leg Press 30lb 2x15     Lumbar Exercises: Standing   Scapular Retraction Power Tower;15 reps   Shoulder Extension Power Tower;15 reps   Other Standing Lumbar Exercises red tband hip ext and abd 15 times BIL     Lumbar Exercises: Supine   Ab Set 20 reps   Clam 15 reps  green tband   Bridge 15 reps;3 seconds  with ball squeeze   Other Supine Lumbar Exercises bridge, KTC and obl 15 times each     Moist Heat Therapy   Number Minutes Moist Heat 15 Minutes   Moist Heat Location Lumbar Spine;Cervical     Electrical Stimulation   Electrical Stimulation Location lumbar area   Electrical Stimulation Action IFC   Electrical Stimulation Parameters supine   Electrical Stimulation Goals Pain                   PT Short Term Goals - 01/08/17 1437      PT SHORT TERM GOAL #1   Title independent with initial HEP   Status Achieved           PT Long Term Goals - 01/08/17 1437      PT LONG TERM GOAL #1   Title understand proper posture and body mechanics   Status Partially Met     PT LONG TERM GOAL #2   Title decrease pain 50%   Status Partially Met     PT LONG TERM GOAL #3   Title increase lumbar ROM to WNL's   Status Achieved     PT LONG TERM GOAL #4   Title sit without difficulty   Status Partially Met               Plan - 01/08/17 1439    Clinical Impression Statement STG met. LTG met for ROM and progressing with all others. tolerated ex fair- slow with some c/o pain   PT Treatment/Interventions ADLs/Self  Care Home Management;Electrical Stimulation;Moist Heat;Therapeutic activities;Therapeutic exercise;Neuromuscular re-education;Patient/family education;Manual techniques   PT Next Visit Plan progress ex and increase func as tolerated      Patient will benefit from skilled therapeutic intervention in order to improve the following deficits and impairments:  Decreased activity tolerance, Decreased mobility, Decreased strength, Postural dysfunction, Improper body mechanics, Impaired flexibility, Pain, Increased muscle spasms, Decreased range of motion  Visit Diagnosis: Acute bilateral low back pain without sciatica  Cervicalgia  Muscle spasm of back     Problem List Patient Active Problem List   Diagnosis Date Noted  . Lumbar radiculopathy 11/14/2016  . Sprain of iliolumbar ligament 10/09/2016    PAYSEUR,ANGIE PTA 01/08/2017, 2:41 PM  [place], [place], [address] Phone: [phone]   Fax:  [phone]  Name: Metztli Sachdev MRN: [id number] Date of Birth: 06-01-1995

## 2017-01-11 ENCOUNTER — Encounter: Payer: Self-pay | Admitting: Physical Therapy

## 2017-01-11 ENCOUNTER — Ambulatory Visit: Payer: 59 | Admitting: Physical Therapy

## 2017-01-11 DIAGNOSIS — M545 Low back pain, unspecified: Secondary | ICD-10-CM

## 2017-01-11 DIAGNOSIS — M542 Cervicalgia: Secondary | ICD-10-CM

## 2017-01-11 DIAGNOSIS — M62830 Muscle spasm of back: Secondary | ICD-10-CM | POA: Diagnosis not present

## 2017-01-11 NOTE — Therapy (Signed)
[place], [place], [address] Phone: [phone]   Fax:  [phone]  Physical Therapy Treatment  Patient Details  Name: Deborah Hawkins MRN: [id number] Date of Birth: 1995-05-06 Referring Provider: Charlann Boxer  Encounter Date: 01/11/2017      PT End of Session - 01/11/17 1126    Visit Number 6   Date for PT Re-Evaluation 02/19/17   PT Start Time 1100   PT Stop Time 1155   PT Time Calculation (min) 55 min      Past Medical History:  Diagnosis Date  . Heart murmur     Past Surgical History:  Procedure Laterality Date  . UTERINE FIBROID SURGERY      There were no vitals filed for this visit.      Subjective Assessment - 01/11/17 1103    Subjective feeling better than earlier in week   Currently in Pain? Yes   Pain Score 5                          [facility] Adult PT Treatment/Exercise - 01/11/17 0001      Lumbar Exercises: Aerobic   Stationary Bike NuStep L5 x6 min     Lumbar Exercises: Machines for Strengthening   Cybex Lumbar Extension standing 20# 2 sets 10   Leg Press 30lb 2x15   Other Lumbar Machine Exercise seated row and lat pull 20# 2 sets 10     Lumbar Exercises: Standing   Other Standing Lumbar Exercises Standing overhead ext with weithted ball   and obl rotation 12 times each     Lumbar Exercises: Seated   Sit to Stand 10 reps  with wt ball chest press     Lumbar Exercises: Supine   Ab Set 15 reps;5 seconds     Lumbar Exercises: Quadruped   Opposite Arm/Leg Raise 20 reps     Modalities   Modalities Moist Heat;Electrical Stimulation     Moist Heat Therapy   Number Minutes Moist Heat 15 Minutes   Moist Heat Location Lumbar Spine;Cervical     Electrical Stimulation   Electrical Stimulation Location lumbar area   Electrical Stimulation Action IFC   Electrical Stimulation Parameters supine   Electrical Stimulation Goals Pain                   PT Short Term Goals - 01/08/17 1437      PT SHORT TERM GOAL #1   Title independent with initial HEP   Status Achieved           PT Long Term Goals - 01/08/17 1437      PT LONG TERM GOAL #1   Title understand proper posture and body mechanics   Status Partially Met     PT LONG TERM GOAL #2   Title decrease pain 50%   Status Partially Met     PT LONG TERM GOAL #3   Title increase lumbar ROM to WNL's   Status Achieved     PT LONG TERM GOAL #4   Title sit without difficulty   Status Partially Met               Plan - 01/11/17 1126    Clinical Impression Statement progressing with core stab and strength, some increased pain but tolerated ex, minimal cuing needed   PT Next Visit Plan progress ex and increase func as tolerated  Patient will benefit from skilled therapeutic intervention in order to improve the following deficits and impairments:  Decreased activity tolerance, Decreased mobility, Decreased strength, Postural dysfunction, Improper body mechanics, Impaired flexibility, Pain, Increased muscle spasms, Decreased range of motion  Visit Diagnosis: Acute bilateral low back pain without sciatica  Cervicalgia  Muscle spasm of back     Problem List Patient Active Problem List   Diagnosis Date Noted  . Lumbar radiculopathy 11/14/2016  . Sprain of iliolumbar ligament 10/09/2016    Cyann Venti,ANGIE PTA 01/11/2017, 11:27 AM  [place], [place], [address] Phone: [phone]   Fax:  [phone]  Name: Areatha Kalata MRN: [id number] Date of Birth: 1995/08/10

## 2017-01-16 ENCOUNTER — Ambulatory Visit: Payer: 59 | Admitting: Physical Therapy

## 2017-01-16 ENCOUNTER — Ambulatory Visit (INDEPENDENT_AMBULATORY_CARE_PROVIDER_SITE_OTHER): Payer: 59 | Admitting: Family Medicine

## 2017-01-16 ENCOUNTER — Encounter: Payer: Self-pay | Admitting: Family Medicine

## 2017-01-16 ENCOUNTER — Encounter: Payer: Self-pay | Admitting: Physical Therapy

## 2017-01-16 DIAGNOSIS — M545 Low back pain, unspecified: Secondary | ICD-10-CM

## 2017-01-16 DIAGNOSIS — M542 Cervicalgia: Secondary | ICD-10-CM

## 2017-01-16 DIAGNOSIS — S338XXD Sprain of other parts of lumbar spine and pelvis, subsequent encounter: Secondary | ICD-10-CM | POA: Diagnosis not present

## 2017-01-16 DIAGNOSIS — M62830 Muscle spasm of back: Secondary | ICD-10-CM

## 2017-01-16 MED ORDER — DICLOFENAC SODIUM 2 % TD SOLN: 2.0000 "application " | Freq: Two times a day (BID) | TRANSDERMAL | 3 refills | Status: AC

## 2017-01-16 NOTE — Assessment & Plan Note (Signed)
I believe the patient is doing much better. No more of a lumbar radiculopathy noted. Patient is seeming to make significant strides. Patient will follow-up with me more on an as-needed basis and will discontinue physical therapy and then in coming weeks.

## 2017-01-16 NOTE — Patient Instructions (Addendum)
Good to see you  Keep up with what you are doing.  PT will be good if you want but continue only if you need.  pennsaid pinkie amount topically 2 times daily as needed.    As long as you do well  See me when you need me.

## 2017-01-16 NOTE — Progress Notes (Signed)
  [name] [address] [place], [place] [address] Phone: [phone] Subjective:    I'm seeing this patient by the request  of:    CC:   [id number]  Deborah Hawkins is a 21 y.o. female coming in for follow up for back pain. She has been doing physical therapy twice a week which she says is helping.   Onset-  Location Duration-  Character- Aggravating factors- Reliving factors-  Therapies tried-  Severity-     Past Medical History:  Diagnosis Date  . Heart murmur    Past Surgical History:  Procedure Laterality Date  . UTERINE FIBROID SURGERY     Social History   Social History  . Marital status: Single    Spouse name: N/A  . Number of children: N/A  . Years of education: N/A   Social History Main Topics  . Smoking status: Never Smoker  . Smokeless tobacco: Never Used  . Alcohol use Yes     Comment: occasional  . Drug use: No  . Sexual activity: Not Currently    Birth control/ protection: Pill   Other Topics Concern  . Not on file   Social History Narrative  . No narrative on file   No Known Allergies Family History  Problem Relation Age of Onset  . Hypertension Other   . Diabetes Other   . Cancer Other      Past medical history, social, surgical and family history all reviewed in electronic medical record.  No pertanent information unless stated regarding to the chief complaint.   Review of Systems:Review of systems updated and as accurate as of 01/16/17  No headache, visual changes, nausea, vomiting, diarrhea, constipation, dizziness, abdominal pain, skin rash, fevers, chills, night sweats, weight loss, swollen lymph nodes, body aches, joint swelling, muscle aches, chest pain, shortness of breath, mood changes.   Objective  There were no vitals taken for this visit. Systems examined below as of 01/16/17   General: No apparent distress alert and oriented x3 mood and affect normal, dressed appropriately.    HEENT: Pupils equal, extraocular movements intact  Respiratory: Patient's speak in full sentences and does not appear short of breath  Cardiovascular: No lower extremity edema, non tender, no erythema  Skin: Warm dry intact with no signs of infection or rash on extremities or on axial skeleton.  Abdomen: Soft nontender  Neuro: Cranial nerves II through XII are intact, neurovascularly intact in all extremities with 2+ DTRs and 2+ pulses.  Lymph: No lymphadenopathy of posterior or anterior cervical chain or axillae bilaterally.  Gait normal with good balance and coordination.  MSK:  Non tender with full range of motion and good stability and symmetric strength and tone of shoulders, elbows, wrist, hip, knee and ankles bilaterally.     Impression and Recommendations:     This case required medical decision making of moderate complexity.      Note: This dictation was prepared with Dragon dictation along with smaller phrase technology. Any transcriptional errors that result from this process are unintentional.
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
## 2017-01-16 NOTE — Therapy (Signed)
[place], [place], [address] Phone: [phone]   Fax:  [phone]  Physical Therapy Treatment  Patient Details  Name: Deborah Hawkins MRN: [id number] Date of Birth: Apr 05, 1996 Referring Provider: Charlann Boxer  Encounter Date: 01/16/2017      PT End of Session - 01/16/17 1428    Visit Number 7   Date for PT Re-Evaluation 02/19/17   PT Start Time 1346   PT Stop Time 1441   PT Time Calculation (min) 55 min   Activity Tolerance Patient tolerated treatment well   Behavior During Therapy [facility] for tasks assessed/performed      Past Medical History:  Diagnosis Date  . Heart murmur     Past Surgical History:  Procedure Laterality Date  . UTERINE FIBROID SURGERY      There were no vitals filed for this visit.      Subjective Assessment - 01/16/17 1349    Subjective Pt reports that she is feeling ok   Currently in Pain? No/denies   Pain Score 0-No pain                         [facility] Adult PT Treatment/Exercise - 01/16/17 0001      Lumbar Exercises: Aerobic   Stationary Bike NuStep L5 x6 min   Elliptical I5 R5 x3 min      Lumbar Exercises: Machines for Strengthening   Leg Press 40lb 3x10   Other Lumbar Machine Exercise seated back ext black tband 2x10   Other Lumbar Machine Exercise seated row and lat pull 20# 2 sets 10     Lumbar Exercises: Standing   Other Standing Lumbar Exercises Standing overhead ext with weigthted ball   OBL rotations x12 each     Modalities   Modalities Moist Heat;Electrical Stimulation     Moist Heat Therapy   Number Minutes Moist Heat 15 Minutes   Moist Heat Location Lumbar Spine;Cervical     Electrical Stimulation   Electrical Stimulation Location lumbar area   Electrical Stimulation Action IFC   Electrical Stimulation Parameters supine   Electrical Stimulation Goals Pain                  PT Short Term Goals -  01/08/17 1437      PT SHORT TERM GOAL #1   Title independent with initial HEP   Status Achieved           PT Long Term Goals - 01/16/17 1355      PT LONG TERM GOAL #2   Title decrease pain 50%   Status Partially Met     PT LONG TERM GOAL #4   Title sit without difficulty   Status Partially Met               Plan - 01/16/17 1428    Clinical Impression Statement Pt reports LE burning on elliptical, progressing well, attempted prone on elbows and hands, increase pain reported with prone on hands.   Rehab Potential Good   PT Frequency 2x / week   PT Treatment/Interventions ADLs/Self Care Home Management;Electrical Stimulation;Moist Heat;Therapeutic activities;Therapeutic exercise;Neuromuscular re-education;Patient/family education;Manual techniques   PT Next Visit Plan progress ex and increase func as tolerated      Patient will benefit from skilled therapeutic intervention in order to improve the following deficits and impairments:  Decreased activity tolerance, Decreased mobility, Decreased strength, Postural dysfunction, Improper body mechanics, Impaired flexibility, Pain,  Increased muscle spasms, Decreased range of motion  Visit Diagnosis: Acute bilateral low back pain without sciatica  Cervicalgia  Muscle spasm of back     Problem List Patient Active Problem List   Diagnosis Date Noted  . Lumbar radiculopathy 11/14/2016  . Sprain of iliolumbar ligament 10/09/2016    Scot Jun, PTA 01/16/2017, 2:30 PM  [place], [place], [address] Phone: [phone]   Fax:  [phone]  Name: Deborah Hawkins MRN: [id number] Date of Birth: 05-25-1995

## 2017-01-17 ENCOUNTER — Ambulatory Visit: Payer: 59 | Admitting: Family Medicine

## 2017-01-22 ENCOUNTER — Ambulatory Visit: Payer: 59 | Attending: Family Medicine | Admitting: Physical Therapy

## 2017-01-22 DIAGNOSIS — M542 Cervicalgia: Secondary | ICD-10-CM | POA: Insufficient documentation

## 2017-01-22 DIAGNOSIS — M62830 Muscle spasm of back: Secondary | ICD-10-CM | POA: Insufficient documentation

## 2017-01-22 DIAGNOSIS — M545 Low back pain: Secondary | ICD-10-CM | POA: Insufficient documentation

## 2017-01-28 ENCOUNTER — Encounter: Payer: Self-pay | Admitting: *Deleted

## 2017-01-28 ENCOUNTER — Ambulatory Visit: Payer: 59 | Admitting: Physical Therapy

## 2017-01-28 ENCOUNTER — Encounter: Payer: Self-pay | Admitting: Physical Therapy

## 2017-01-28 DIAGNOSIS — M542 Cervicalgia: Secondary | ICD-10-CM | POA: Diagnosis present

## 2017-01-28 DIAGNOSIS — M62830 Muscle spasm of back: Secondary | ICD-10-CM | POA: Diagnosis not present

## 2017-01-28 DIAGNOSIS — M545 Low back pain, unspecified: Secondary | ICD-10-CM

## 2017-01-28 NOTE — Therapy (Addendum)
[place], [place], [address] Phone: [phone]   Fax:  [phone]  Physical Therapy Treatment  Patient Details  Name: Deborah Hawkins MRN: [id number] Date of Birth: 1995/10/18 Referring Provider: Charlann Boxer  Encounter Date: 01/28/2017      PT End of Session - 01/28/17 1520    Visit Number 8   Date for PT Re-Evaluation 02/19/17   PT Start Time 1435   PT Stop Time 1531   PT Time Calculation (min) 56 min   Activity Tolerance Patient tolerated treatment well   Behavior During Therapy [facility] for tasks assessed/performed      Past Medical History:  Diagnosis Date  . Heart murmur     Past Surgical History:  Procedure Laterality Date  . UTERINE FIBROID SURGERY      There were no vitals filed for this visit.      Subjective Assessment - 01/28/17 1437    Subjective "Im ok"   Currently in Pain? No/denies   Pain Score 0-No pain                         [facility] Adult PT Treatment/Exercise - 01/28/17 0001      Lumbar Exercises: Aerobic   Stationary Bike NuStep L5 x6 min   Elliptical I5 R5 x4 min      Lumbar Exercises: Machines for Strengthening   Cybex Knee Extension 10lb 2x10    Cybex Knee Flexion 25lb 2x10   Leg Press 40lb 3x10   Other Lumbar Machine Exercise seated row and lat pull 20# 2 sets 10     Lumbar Exercises: Standing   Row Both;10 reps;Power Automotive engineer, rev grip   Row Limitations 20lb   Other Standing Lumbar Exercises standing overhead exy yellow ball 2x10      Modalities   Modalities Moist Heat;Electrical Stimulation     Moist Heat Therapy   Number Minutes Moist Heat 15 Minutes   Moist Heat Location Lumbar Spine;Cervical     Electrical Stimulation   Electrical Stimulation Location lumbar area   Electrical Stimulation Action IFC   Electrical Stimulation Parameters supine   Electrical Stimulation Goals Pain                  PT Short  Term Goals - 01/08/17 1437      PT SHORT TERM GOAL #1   Title independent with initial HEP   Status Achieved           PT Long Term Goals - 01/28/17 1521      PT LONG TERM GOAL #1   Title understand proper posture and body mechanics   Status Partially Met     PT LONG TERM GOAL #2   Title decrease pain 50%   Status Partially Met     PT LONG TERM GOAL #3   Title increase lumbar ROM to WNL's   Status Achieved               Plan - 01/28/17 1521    Clinical Impression Statement LE burning on elliptical. PT with improved strength on all machine level interventions. Repots that she is doing better overall.   Rehab Potential Good   PT Frequency 2x / week   PT Treatment/Interventions ADLs/Self Care Home Management;Electrical Stimulation;Moist Heat;Therapeutic activities;Therapeutic exercise;Neuromuscular re-education;Patient/family education;Manual techniques   PT Next Visit Plan progress ex and increase func as tolerated      Patient  will benefit from skilled therapeutic intervention in order to improve the following deficits and impairments:  Decreased activity tolerance, Decreased mobility, Decreased strength, Postural dysfunction, Improper body mechanics, Impaired flexibility, Pain, Increased muscle spasms, Decreased range of motion  Visit Diagnosis: Muscle spasm of back  Acute bilateral low back pain without sciatica  Cervicalgia     Problem List Patient Active Problem List   Diagnosis Date Noted  . Lumbar radiculopathy 11/14/2016  . Sprain of iliolumbar ligament 10/09/2016    Scot Jun, PTA 01/28/2017, 3:22 PM  [place], [place], [address] Phone: [phone]   Fax:  [phone]  Name: Deborah Hawkins MRN: [id number] Date of Birth: 1996/04/03   PHYSICAL THERAPY DISCHARGE SUMMARY  Visits from Start of Care: 8  Plan: Patient agrees to discharge.   Patient goals were partially met. Patient is being discharged due to not returning since the last visit.  ?????      Lyndee Hensen, PT, DPT 2:54 PM  07/09/18

## 2017-02-04 ENCOUNTER — Ambulatory Visit (INDEPENDENT_AMBULATORY_CARE_PROVIDER_SITE_OTHER): Payer: 59 | Admitting: Family Medicine

## 2017-02-04 ENCOUNTER — Encounter: Payer: Self-pay | Admitting: Family Medicine

## 2017-02-04 VITALS — BP 122/64 | HR 74 | Temp 98.6°F | Ht 65.0 in | Wt 137.0 lb

## 2017-02-04 DIAGNOSIS — M898X1 Other specified disorders of bone, shoulder: Secondary | ICD-10-CM | POA: Diagnosis not present

## 2017-02-04 DIAGNOSIS — S338XXD Sprain of other parts of lumbar spine and pelvis, subsequent encounter: Secondary | ICD-10-CM | POA: Diagnosis not present

## 2017-02-04 NOTE — Assessment & Plan Note (Signed)
Reports an acute pain. Has improvement with manipulation. Has an appointment for manipulation already scheduled.

## 2017-02-04 NOTE — Assessment & Plan Note (Addendum)
Does not appear to have any winging of the scapular or scapular dyskinesis. No inciting event. Imaging of her cervical spine has been normal. No radicular symptoms. - Counseled on home exercise therapy -  heat and ice. - Provided thera band - Continue Pennsaid - Could consider muscle relaxer if no improvement
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
## 2017-02-04 NOTE — Patient Instructions (Addendum)
Thank you for coming in,   Please try the exercises that I have provided.   Please try ice and heat.   Please continue rubbing Pennsaid over the area.    Please feel free to call with any questions or concerns at any time, at [phone]. --Dr. Jordan Likes

## 2017-02-05 ENCOUNTER — Ambulatory Visit: Payer: 59 | Admitting: Family Medicine
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
## 2017-02-11 ENCOUNTER — Ambulatory Visit (INDEPENDENT_AMBULATORY_CARE_PROVIDER_SITE_OTHER): Payer: 59 | Admitting: Family Medicine

## 2017-02-11 ENCOUNTER — Encounter: Payer: Self-pay | Admitting: Family Medicine

## 2017-02-11 DIAGNOSIS — M999 Biomechanical lesion, unspecified: Secondary | ICD-10-CM | POA: Insufficient documentation

## 2017-02-11 DIAGNOSIS — S338XXD Sprain of other parts of lumbar spine and pelvis, subsequent encounter: Secondary | ICD-10-CM
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
## 2017-02-11 NOTE — Assessment & Plan Note (Signed)
Decision today to treat with OMT was based on Physical Exam  After verbal consent patient was treated with HVLA, ME, FPR techniques in  thoracic, lumbar and sacral areas  Patient tolerated the procedure well with improvement in symptoms  Patient given exercises, stretches and lifestyle modifications  See medications in patient instructions if given  Patient will follow up in 4-6 weeks 

## 2017-02-11 NOTE — Patient Instructions (Signed)
Good to see you  I hope this makes a difference  Try duexis 3 times a day for 3 days and see if it helps. If so call and we will give prescription.  Otherwise get in the gym See me again in 3 weeks.
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
## 2017-03-04 ENCOUNTER — Ambulatory Visit (INDEPENDENT_AMBULATORY_CARE_PROVIDER_SITE_OTHER): Payer: 59 | Admitting: Family Medicine

## 2017-03-04 ENCOUNTER — Encounter: Payer: Self-pay | Admitting: Family Medicine

## 2017-03-04 VITALS — BP 100/60 | HR 72 | Ht 65.0 in | Wt 144.0 lb

## 2017-03-04 DIAGNOSIS — M999 Biomechanical lesion, unspecified: Secondary | ICD-10-CM

## 2017-03-04 DIAGNOSIS — S338XXD Sprain of other parts of lumbar spine and pelvis, subsequent encounter: Secondary | ICD-10-CM | POA: Diagnosis not present

## 2017-03-04 NOTE — Assessment & Plan Note (Signed)
No radicular symptoms.  Continues to have some stiffness.  We will make no changes to patient's FMLA today.  As well as hip abductor strengthening.  Follow-up again in 5 weeks

## 2017-03-04 NOTE — Assessment & Plan Note (Signed)
Decision today to treat with OMT was based on Physical Exam  After verbal consent patient was treated with HVLA, ME, FPR techniques in  thoracic, lumbar and sacral areas  Patient tolerated the procedure well with improvement in symptoms  Patient given exercises, stretches and lifestyle modifications  See medications in patient instructions if given  Patient will follow up in 4-5 weeks 

## 2017-03-04 NOTE — Patient Instructions (Signed)
Good to see you  Deborah Hawkins is your friend.  Stay active Keep going to the gym  See me again in 4 weeks!

## 2017-03-13 ENCOUNTER — Telehealth: Payer: Self-pay | Admitting: Family Medicine

## 2017-03-13 NOTE — Telephone Encounter (Signed)
Patient is requesting call back in [facility].

## 2017-03-13 NOTE — Telephone Encounter (Signed)
Called pt, unable to leave msg

## 2017-03-13 NOTE — Telephone Encounter (Signed)
Pt dropped off FMLA papers says deadline 03/27/17. Pt request correction #6 on page "med 4 of 6".  Pt states came on 02/05/17 but was not seen because her appt was that morning and she thought it was that afternoon. Pt says employer needs letter saying she came for appt but was not seen. Also complete forms.

## 2017-03-13 NOTE — Telephone Encounter (Signed)
Patient has called back.  States she came into the office to be seen on 10/16 but got her dates confused so she was not able to be seen.  States her job is needing her to be written out of work for this day.  Patient states she is dropping off additional FMLA forms that need to be completed for this.  Patient requesting call back in regard.

## 2017-03-18 NOTE — Telephone Encounter (Signed)
Pt came in & updated FMLA.

## 2017-03-18 NOTE — Telephone Encounter (Signed)
Patient has called back in regard.  I lost patient on the phone.  Tried to call back but phone rung then went to VM box that was not set up.

## 2017-03-27 ENCOUNTER — Telehealth: Payer: Self-pay | Admitting: *Deleted

## 2017-03-27 NOTE — Telephone Encounter (Signed)
Copied from CRM [phone]. Topic: Inquiry >> Mar 26, 2017 12:29 PM Terisa Starraylor, Brittany L wrote: Reason for CRM:  Pt states she needs a letter stating a reason why her FMLA paperwork was late. Missing information by the provider. She would like a call back when ready for p/u, she only has until the 6th to turn this in. Cal back @ [phone] >> Mar 26, 2017  4:51 PM Claris PongBlackwood, Samantha J wrote: Please call patient back

## 2017-03-27 NOTE — Telephone Encounter (Signed)
Called patient twice, went straight to vmail & she does not have a vmail box set up.

## 2017-03-27 NOTE — Telephone Encounter (Signed)
Called pt again, unable to leave a msg.

## 2017-04-01 ENCOUNTER — Telehealth: Payer: Self-pay | Admitting: *Deleted

## 2017-04-01 ENCOUNTER — Ambulatory Visit: Payer: 59 | Admitting: Family Medicine

## 2017-04-01 NOTE — Telephone Encounter (Signed)
Called patient to notify of our office being closed tomorrow until 10 am and to reschedule her appointment.  No answer and no voicemail set up to leave a message.

## 2017-04-02 ENCOUNTER — Ambulatory Visit (INDEPENDENT_AMBULATORY_CARE_PROVIDER_SITE_OTHER): Payer: 59 | Admitting: Obstetrics & Gynecology

## 2017-04-02 ENCOUNTER — Encounter: Payer: Self-pay | Admitting: Obstetrics & Gynecology

## 2017-04-02 VITALS — BP 95/73 | HR 67 | Wt 139.7 lb

## 2017-04-02 DIAGNOSIS — Z3009 Encounter for other general counseling and advice on contraception: Secondary | ICD-10-CM

## 2017-04-02 DIAGNOSIS — N946 Dysmenorrhea, unspecified: Secondary | ICD-10-CM

## 2017-04-02 NOTE — Patient Instructions (Signed)

## 2017-04-02 NOTE — Progress Notes (Signed)
History:  21 y.o. G1P1000, nicknamed  "Pumpkin" here today for f/u of dysmenorrhea. Pt reports that her sx are improved on the higher dosage of OCPs.  She reports that her last cycle was late but, did occur.  She reports that she has missed pills.  She is sexually active and does not use condoms.  The following portions of the patient's history were reviewed and updated as appropriate: allergies, current medications, past family history, past medical history, past social history, past surgical history and problem list.  Review of Systems:  Pertinent items are noted in HPI.   Objective:  Physical Exam Blood pressure 95/73, pulse 67, weight 139 lb 11.2 oz (63.4 kg), last menstrual period 03/22/2017. CONSTITUTIONAL: Well-developed, well-nourished female in no acute distress.  HENT:  Normocephalic, atraumatic EYES: Conjunctivae and EOM are normal. No scleral icterus.  NECK: Normal range of motion SKIN: Skin is warm and dry. No rash noted. Not diaphoretic.No pallor. NEUROLGIC: Alert and oriented to person, place, and time. Normal coordination.    Assessment & Plan:  Dysmenorrhea improved on OCPs.  Pt is missing pills.  She has been missing pills so, I have reccommended the LnIUD. She thinks that she wants the IUD. She would like to read more about it and come back for the device in 4 weeks.      Keep OCPs LoEstrin 1.5/30mg  1po q day F/u in 4 weeks for IUD F/u sooner prn  Total face-to-face time with patient was 15 min.  Greater than 50% was spent in counseling and coordination of care with the patient.   Ohanna Gassert L. Harraway-Smith, M.D., [facility]

## 2017-04-04 ENCOUNTER — Ambulatory Visit (INDEPENDENT_AMBULATORY_CARE_PROVIDER_SITE_OTHER): Payer: 59 | Admitting: Family Medicine

## 2017-04-04 ENCOUNTER — Encounter: Payer: Self-pay | Admitting: Family Medicine

## 2017-04-04 VITALS — BP 120/70 | HR 72 | Ht 65.0 in | Wt 140.0 lb

## 2017-04-04 DIAGNOSIS — S338XXD Sprain of other parts of lumbar spine and pelvis, subsequent encounter: Secondary | ICD-10-CM | POA: Diagnosis not present

## 2017-04-04 DIAGNOSIS — M999 Biomechanical lesion, unspecified: Secondary | ICD-10-CM

## 2017-04-04 NOTE — Assessment & Plan Note (Signed)
Decision today to treat with OMT was based on Physical Exam  After verbal consent patient was treated with HVLA, ME, FPR techniques in  thoracic, lumbar and sacral areas  Patient tolerated the procedure well with improvement in symptoms  Patient given exercises, stretches and lifestyle modifications  See medications in patient instructions if given  Patient will follow up in  6-8 weeks 

## 2017-04-04 NOTE — Assessment & Plan Note (Signed)
Doing fairly well still with the conservative approach.  Patient responding better to us to icing regimen and home exercises, discussed which activities to do which wants to avoid.  Patient is increasing activity slowly over the course the next several weeks again.  Follow-up again with me in 6-8 weeks
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
## 2017-04-04 NOTE — Patient Instructions (Signed)
Good to see you  On wall with heels, butt shoulder and head touching for a goal of 5 minutes daily  Ice when you need it Continue the vitamins Have a great holiday See me again in 4-5 weeks

## 2017-04-11 ENCOUNTER — Ambulatory Visit: Payer: 59 | Admitting: Family Medicine

## 2017-05-02 ENCOUNTER — Ambulatory Visit: Payer: 59 | Admitting: Obstetrics & Gynecology

## 2017-05-05 NOTE — Progress Notes (Signed)
Deborah ScaleZach Andrej Hawkins D.O. [name] [address] [place], [place] [address] Phone: [phone] Subjective:    I'm seeing this patient by the request  of:    CC: Back pain follow-up  ZHY:QMVHQIONGEHPI:Subjective  Deborah MccallumJakinula Hawkins is a 22 y.o. female coming in with complaint of back pain.  Patient has had significant workup including MRI that was unremarkable.  Patient though has been making some progress with the osteopathic manipulation and home exercises working on core strength and stability.  Patient states that her back has been doing good since last visit.     Past Medical History:  Diagnosis Date  . Heart murmur    Past Surgical History:  Procedure Laterality Date  . UTERINE FIBROID SURGERY     Social History   Socioeconomic History  . Marital status: Single    Spouse name: None  . Number of children: None  . Years of education: None  . Highest education level: None  Social Needs  . Financial resource strain: None  . Food insecurity - worry: None  . Food insecurity - inability: None  . Transportation needs - medical: None  . Transportation needs - non-medical: None  Occupational History  . None  Tobacco Use  . Smoking status: Never Smoker  . Smokeless tobacco: Never Used  Substance and Sexual Activity  . Alcohol use: Yes    Comment: occasional  . Drug use: No  . Sexual activity: Not Currently    Birth control/protection: Pill  Other Topics Concern  . None  Social History Narrative  . None   No Known Allergies Family History  Problem Relation Age of Onset  . Hypertension Other   . Diabetes Other   . Cancer Other      Past medical history, social, surgical and family history all reviewed in electronic medical record.  No pertanent information unless stated regarding to the chief complaint.   Review of Systems:Review of systems updated and as accurate as of 05/06/17  No headache, visual changes, nausea, vomiting, diarrhea, constipation, dizziness,  abdominal pain, skin rash, fevers, chills, night sweats, weight loss, swollen lymph nodes, body aches, joint swelling,  chest pain, shortness of breath, mood changes.  Positive muscle aches  Objective  Blood pressure 96/66, pulse 65, height 5\' 5"  (1.651 m), weight 140 lb (63.5 kg), SpO2 97 %. Systems examined below as of 05/06/17   General: No apparent distress alert and oriented x3 mood and affect normal, dressed appropriately.  HEENT: Pupils equal, extraocular movements intact  Respiratory: Patient's speak in full sentences and does not appear short of breath  Cardiovascular: No lower extremity edema, non tender, no erythema  Skin: Warm dry intact with no signs of infection or rash on extremities or on axial skeleton.  Abdomen: Soft nontender  Neuro: Cranial nerves II through XII are intact, neurovascularly intact in all extremities with 2+ DTRs and 2+ pulses.  Lymph: No lymphadenopathy of posterior or anterior cervical chain or axillae bilaterally.  Gait normal with good balance and coordination.  MSK:  Non tender with full range of motion and good stability and symmetric strength and tone of shoulders, elbows, wrist, hip, knee and ankles bilaterally.   Back Exam:  Inspection: Mild loss of lordosis Motion: Flexion 45 deg, Extension 25 deg, Side Bending to 45 deg bilaterally,  Rotation to 35 deg bilaterally  SLR laying: Negative  XSLR laying: Negative  Palpable tenderness: Tender to palpation in the paraspinal musculature.Marland Kitchen. FABER: Positive right. Sensory change: Gross sensation intact  to all lumbar and sacral dermatomes.  Reflexes: 2+ at both patellar tendons, 2+ at achilles tendons, Babinski's downgoing.  Strength at foot  Plantar-flexion: 5/5 Dorsi-flexion: 5/5 Eversion: 5/5 Inversion: 5/5  Leg strength  Quad: 5/5 Hamstring: 5/5 Hip flexor: 5/5 Hip abductors: 4/5  Gait unremarkable.  Osteopathic findings C2 flexed rotated and side bent right C7 flexed rotated and side bent  left T3 extended rotated and side bent right inhaled third rib T11 extended rotated and side bent left L3 flexed rotated and side bent right Sacrum right on right     Impression and Recommendations:     This case required medical decision making of moderate complexity.      Note: This dictation was prepared with Dragon dictation along with smaller phrase technology. Any transcriptional errors that result from this process are unintentional.

## 2017-05-06 ENCOUNTER — Encounter: Payer: Self-pay | Admitting: Family Medicine

## 2017-05-06 ENCOUNTER — Ambulatory Visit (INDEPENDENT_AMBULATORY_CARE_PROVIDER_SITE_OTHER): Payer: 59 | Admitting: Family Medicine

## 2017-05-06 VITALS — BP 96/66 | HR 65 | Ht 65.0 in | Wt 140.0 lb

## 2017-05-06 DIAGNOSIS — M999 Biomechanical lesion, unspecified: Secondary | ICD-10-CM | POA: Diagnosis not present

## 2017-05-06 DIAGNOSIS — S338XXD Sprain of other parts of lumbar spine and pelvis, subsequent encounter: Secondary | ICD-10-CM

## 2017-05-06 NOTE — Patient Instructions (Signed)
Good to see you  Deborah Hawkins is your friend.  You are making progress! Keep it up  See me again in 5-6 weeks

## 2017-05-06 NOTE — Assessment & Plan Note (Signed)
Decision today to treat with OMT was based on Physical Exam  After verbal consent patient was treated with HVLA, ME, FPR techniques in cervical, thoracic, lumbar and sacral areas  Patient tolerated the procedure well with improvement in symptoms  Patient given exercises, stretches and lifestyle modifications  See medications in patient instructions if given  Patient will follow up in 4 weeks 

## 2017-05-06 NOTE — Assessment & Plan Note (Signed)
Continue to monitor closely.  We discussed icing regimen.  Discussed core strengthening.  Patient will come back in 5-6 weeks.  No change otherwise.

## 2017-05-09 ENCOUNTER — Encounter: Payer: Self-pay | Admitting: *Deleted

## 2017-05-17 ENCOUNTER — Ambulatory Visit: Payer: 59 | Admitting: Obstetrics & Gynecology

## 2017-06-09 NOTE — Progress Notes (Deleted)
  Tawana ScaleZach Ceriah Kohler D.O. [facility] [address] [place], [place] [address] Phone: [phone] Subjective:    I'm seeing this patient by the request  of:    CC:   ZHY:QMVHQIONGEHPI:Subjective  Deborah Hawkins is a 22 y.o. female coming in with complaint of ***  Onset-  Location Duration-  Character- Aggravating factors- Reliving factors-  Therapies tried-  Severity-     Past Medical History:  Diagnosis Date  . Heart murmur    Past Surgical History:  Procedure Laterality Date  . UTERINE FIBROID SURGERY     Social History   Socioeconomic History  . Marital status: Single    Spouse name: Not on file  . Number of children: Not on file  . Years of education: Not on file  . Highest education level: Not on file  Social Needs  . Financial resource strain: Not on file  . Food insecurity - worry: Not on file  . Food insecurity - inability: Not on file  . Transportation needs - medical: Not on file  . Transportation needs - non-medical: Not on file  Occupational History  . Not on file  Tobacco Use  . Smoking status: Never Smoker  . Smokeless tobacco: Never Used  Substance and Sexual Activity  . Alcohol use: Yes    Comment: occasional  . Drug use: No  . Sexual activity: Not Currently    Birth control/protection: Pill  Other Topics Concern  . Not on file  Social History Narrative  . Not on file   No Known Allergies Family History  Problem Relation Age of Onset  . Hypertension Other   . Diabetes Other   . Cancer Other      Past medical history, social, surgical and family history all reviewed in electronic medical record.  No pertanent information unless stated regarding to the chief complaint.   Review of Systems:Review of systems updated and as accurate as of 06/09/17  No headache, visual changes, nausea, vomiting, diarrhea, constipation, dizziness, abdominal pain, skin rash, fevers, chills, night sweats, weight loss, swollen lymph nodes, body aches, joint  swelling, muscle aches, chest pain, shortness of breath, mood changes.   Objective  There were no vitals taken for this visit. Systems examined below as of 06/09/17   General: No apparent distress alert and oriented x3 mood and affect normal, dressed appropriately.  HEENT: Pupils equal, extraocular movements intact  Respiratory: Patient's speak in full sentences and does not appear short of breath  Cardiovascular: No lower extremity edema, non tender, no erythema  Skin: Warm dry intact with no signs of infection or rash on extremities or on axial skeleton.  Abdomen: Soft nontender  Neuro: Cranial nerves II through XII are intact, neurovascularly intact in all extremities with 2+ DTRs and 2+ pulses.  Lymph: No lymphadenopathy of posterior or anterior cervical chain or axillae bilaterally.  Gait normal with good balance and coordination.  MSK:  Non tender with full range of motion and good stability and symmetric strength and tone of shoulders, elbows, wrist, hip, knee and ankles bilaterally.     Impression and Recommendations:     This case required medical decision making of moderate complexity.      Note: This dictation was prepared with Dragon dictation along with smaller phrase technology. Any transcriptional errors that result from this process are unintentional.

## 2017-06-10 ENCOUNTER — Ambulatory Visit: Payer: 59 | Admitting: Family Medicine

## 2017-06-10 DIAGNOSIS — Z0289 Encounter for other administrative examinations: Secondary | ICD-10-CM

## 2017-06-12 ENCOUNTER — Ambulatory Visit: Payer: 59 | Admitting: Obstetrics & Gynecology

## 2017-06-12 DIAGNOSIS — Z01419 Encounter for gynecological examination (general) (routine) without abnormal findings: Secondary | ICD-10-CM

## 2017-06-14 ENCOUNTER — Encounter: Payer: Self-pay | Admitting: Obstetrics & Gynecology

## 2017-07-26 ENCOUNTER — Encounter (INDEPENDENT_AMBULATORY_CARE_PROVIDER_SITE_OTHER): Payer: Self-pay

## 2017-08-01 ENCOUNTER — Ambulatory Visit: Payer: 59 | Admitting: Obstetrics & Gynecology

## 2017-08-28 ENCOUNTER — Encounter: Payer: Self-pay | Admitting: *Deleted

## 2017-08-29 ENCOUNTER — Ambulatory Visit (INDEPENDENT_AMBULATORY_CARE_PROVIDER_SITE_OTHER): Payer: 59 | Admitting: Obstetrics & Gynecology

## 2017-08-29 ENCOUNTER — Encounter: Payer: Self-pay | Admitting: Obstetrics & Gynecology

## 2017-08-29 VITALS — BP 108/67 | HR 78 | Ht 65.0 in | Wt 141.0 lb

## 2017-08-29 DIAGNOSIS — Z3042 Encounter for surveillance of injectable contraceptive: Secondary | ICD-10-CM | POA: Diagnosis not present

## 2017-08-29 DIAGNOSIS — Z01419 Encounter for gynecological examination (general) (routine) without abnormal findings: Secondary | ICD-10-CM

## 2017-08-29 DIAGNOSIS — N6310 Unspecified lump in the right breast, unspecified quadrant: Secondary | ICD-10-CM | POA: Diagnosis not present

## 2017-08-29 DIAGNOSIS — Z113 Encounter for screening for infections with a predominantly sexual mode of transmission: Secondary | ICD-10-CM

## 2017-08-29 DIAGNOSIS — Z30013 Encounter for initial prescription of injectable contraceptive: Secondary | ICD-10-CM

## 2017-08-29 DIAGNOSIS — Z3202 Encounter for pregnancy test, result negative: Secondary | ICD-10-CM | POA: Diagnosis not present

## 2017-08-29 MED ORDER — MEDROXYPROGESTERONE ACETATE 150 MG/ML IM SUSP
150.0000 mg | Freq: Once | INTRAMUSCULAR | Status: AC
  Administered 2017-08-29: 150 mg via INTRAMUSCULAR

## 2017-08-29 MED ORDER — MEDROXYPROGESTERONE ACETATE 150 MG/ML IM SUSP: 150.0000 mg | Freq: Once | INTRAMUSCULAR | Status: AC

## 2017-08-29 NOTE — Progress Notes (Addendum)
Subjective:     Deborah Hawkins is a 22 y.o. female here for a routine exam.  Current complaints: none. Pt denies exposure to known STI but, she wants to be screened. She also wants to begin contraception She is using condoms irreg.    Gynecologic History Patient's last menstrual period was 08/16/2017 (exact date). Contraception: condoms Last Pap: 08/21/2017. Results were: normal Last mammogram: n/a.   Obstetric History OB History  Gravida Para Term Preterm AB Living  SAB TAB Ectopic Multiple Live Births               # Outcome Date GA Lbr Len/2nd Weight Sex Delivery Anes PTL Lv  1 Term 2014     Vag-Spont        The following portions of the patient's history were reviewed and updated as appropriate: allergies, current medications, past family history, past medical history, past social history, past surgical history and problem list.  Review of Systems Pertinent items are noted in HPI.    Objective:  BP 108/67   Pulse 78   Ht  (1.651 m)   Wt 141 lb (64 kg)   LMP 08/16/2017 (Exact Date)   BMI 23.46 kg/m   General Appearance:    Alert, cooperative, no distress, appears stated age  Head:    Normocephalic, without obvious abnormality, atraumatic  Eyes:    conjunctiva/corneas clear, EOM's intact, both eyes  Ears:    Normal external ear canals, both ears  Nose:   Nares normal, septum midline, mucosa normal, no drainage    or sinus tenderness  Throat:   Lips, mucosa, and tongue normal; teeth and gums normal  Neck:   Supple, symmetrical, trachea midline, no adenopathy;    thyroid:  no enlargement/tenderness/nodules  Back:     Symmetric, no curvature, ROM normal, no CVA tenderness  Lungs:     Clear to auscultation bilaterally, respirations unlabored  Chest Wall:    No tenderness or deformity   Heart:    Regular rate and rhythm, S1 and S2 normal, no murmur, rub   or gallop  Breast Exam:    No tenderness or nipple abnormality. On the right side there is a  large firm mobile mass 4cm x 3 cm in the upper outer quad.      Abdomen:     Soft, non-tender, bowel sounds active all four quadrants,    no masses, no organomegaly  Genitalia:    Normal female without lesion, discharge or tenderness     Extremities:   Extremities normal, atraumatic, no cyanosis or edema  Pulses:   2+ and symmetric all extremities  Skin:   Skin color, texture, turgor normal, no rashes or lesions       Assessment:    Healthy female exam.   STI screen Contraception counseling. Reviewed all forms of birth control options available including abstinence; over the counter/barrier methods; hormonal contraceptive medication including pill, patch, ring, injection,contraceptive implant; hormonal and nonhormonal IUDs. Risks and benefits reviewed.  Questions were answered.  Information was given to patient to review.    Right breast mass- possible fibroadenoma. I have stressed to pt the importance of follow for breast imaging and resection    Plan:  F/u  cx Serum STI screen  F/u in 1 yer or sooner prn Depo provera  IM today . Repeat in 12 weeks   Right breast US  Deborah Hawkins L. Harraway-Smith, M.D., [facility]

## 2017-08-30 LAB — HEPATITIS C ANTIBODY: Hep C Virus Ab: <0.1 s/co ratio (ref 0.0–0.9)

## 2017-08-30 LAB — CERVICOVAGINAL ANCILLARY ONLY
Bacterial vaginitis: NEGATIVE
CANDIDA VAGINITIS: NEGATIVE
CHLAMYDIA, DNA PROBE: NEGATIVE
NEISSERIA GONORRHEA: NEGATIVE
Trichomonas: NEGATIVE

## 2017-08-30 LAB — HIV ANTIBODY (ROUTINE TESTING W REFLEX): HIV Screen 4th Generation wRfx: NONREACTIVE

## 2017-08-30 LAB — RPR: RPR Ser Ql: NONREACTIVE

## 2017-08-30 LAB — HEPATITIS B SURFACE ANTIGEN: Hepatitis B Surface Ag: NEGATIVE

## 2017-09-03 ENCOUNTER — Encounter: Payer: Self-pay | Admitting: *Deleted

## 2017-09-18 ENCOUNTER — Other Ambulatory Visit: Payer: 59

## 2017-09-20 ENCOUNTER — Ambulatory Visit
Admission: RE | Admit: 2017-09-20 | Discharge: 2017-09-20 | Disposition: A | Discharge status: Home / Self Care | Payer: 59 | Source: Ambulatory Visit | Attending: Obstetrics & Gynecology | Admitting: Obstetrics & Gynecology

## 2017-09-20 DIAGNOSIS — N6310 Unspecified lump in the right breast, unspecified quadrant: Secondary | ICD-10-CM

## 2017-11-14 ENCOUNTER — Ambulatory Visit: Payer: 59

## 2017-11-15 ENCOUNTER — Ambulatory Visit (INDEPENDENT_AMBULATORY_CARE_PROVIDER_SITE_OTHER): Payer: Medicaid Other

## 2017-11-15 VITALS — BP 91/50 | HR 64 | Wt 141.5 lb

## 2017-11-15 DIAGNOSIS — Z3042 Encounter for surveillance of injectable contraceptive: Secondary | ICD-10-CM | POA: Diagnosis not present

## 2017-11-15 MED ORDER — MEDROXYPROGESTERONE ACETATE 150 MG/ML IM SUSP
150.0000 mg | Freq: Once | INTRAMUSCULAR | Status: AC
Start: 2017-11-15 — End: 2017-11-15
  Administered 2017-11-15: 150 mg via INTRAMUSCULAR

## 2017-11-15 NOTE — Progress Notes (Signed)
Chart reviewed for nurse visit. Agree with plan of care.   Mantaj Chamberlin Lorraine, CNM 11/15/2017 5:12 PM   

## 2017-11-15 NOTE — Progress Notes (Signed)
Pt here today for Depo Provera 150 mg.  Tolerated well. Pt advised to schedule her next appt during Oct 11-25 for next injection.  Pt stated understanding with no further questions.

## 2018-01-16 ENCOUNTER — Telehealth: Payer: Self-pay | Admitting: Family Medicine

## 2018-01-16 NOTE — Telephone Encounter (Signed)
Called patient in regards to her depo appt being changed 3p on 10/11 to 9am 10/11 due to the office being closed after 1:30. No answer and VMs were not setup. Sent patient a [employer].

## 2018-01-31 ENCOUNTER — Ambulatory Visit (INDEPENDENT_AMBULATORY_CARE_PROVIDER_SITE_OTHER): Payer: Medicaid Other

## 2018-01-31 VITALS — Wt 138.8 lb

## 2018-01-31 DIAGNOSIS — Z3042 Encounter for surveillance of injectable contraceptive: Secondary | ICD-10-CM

## 2018-01-31 MED ORDER — MEDROXYPROGESTERONE ACETATE 150 MG/ML IM SUSP
150.0000 mg | Freq: Once | INTRAMUSCULAR | Status: AC
  Administered 2018-01-31: 150 mg via INTRAMUSCULAR

## 2018-01-31 NOTE — Progress Notes (Addendum)
Deborah Hawkins here for Depo-Provera  Injection.  Injection administered without complication. Patient will return in 3 months for next injection.  [name], CMA 01/31/2018  10:05 AM   Attestation of Attending Supervision of RN: Evaluation and management procedures were performed by the nurse under my supervision and collaboration.  I have reviewed the nursing note and chart, and I agree with the management and plan.  Carolyn L. Harraway-Smith, M.D., [facility]

## 2018-04-21 ENCOUNTER — Ambulatory Visit: Payer: Medicaid Other
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
